# Patient Record
Sex: Male | Born: 1960
Health system: Southern US, Community
[De-identification: ages and names within clinical notes are randomized; demographics above are authoritative.]

## PROBLEM LIST (undated history)

## (undated) DIAGNOSIS — E785 Hyperlipidemia, unspecified: Secondary | ICD-10-CM

## (undated) DIAGNOSIS — R7303 Prediabetes: Secondary | ICD-10-CM

## (undated) DIAGNOSIS — Z87442 Personal history of urinary calculi: Secondary | ICD-10-CM

## (undated) DIAGNOSIS — C801 Malignant (primary) neoplasm, unspecified: Secondary | ICD-10-CM

---

## 1987-12-16 HISTORY — PX: VASECTOMY: SHX75

## 2010-12-15 HISTORY — PX: BACK SURGERY: SHX140

## 2011-05-13 ENCOUNTER — Ambulatory Visit: Payer: Self-pay | Admitting: Internal Medicine

## 2011-05-14 ENCOUNTER — Emergency Department: Payer: Self-pay | Admitting: Emergency Medicine

## 2011-05-20 ENCOUNTER — Ambulatory Visit: Payer: Self-pay

## 2013-09-09 ENCOUNTER — Ambulatory Visit: Payer: Self-pay | Admitting: Gastroenterology

## 2013-12-15 DIAGNOSIS — C801 Malignant (primary) neoplasm, unspecified: Secondary | ICD-10-CM

## 2013-12-15 HISTORY — DX: Malignant (primary) neoplasm, unspecified: C80.1

## 2016-12-15 DIAGNOSIS — E785 Hyperlipidemia, unspecified: Secondary | ICD-10-CM

## 2016-12-15 HISTORY — DX: Hyperlipidemia, unspecified: E78.5

## 2017-07-27 DIAGNOSIS — E7800 Pure hypercholesterolemia, unspecified: Secondary | ICD-10-CM | POA: Diagnosis not present

## 2017-07-27 DIAGNOSIS — R7302 Impaired glucose tolerance (oral): Secondary | ICD-10-CM | POA: Diagnosis not present

## 2017-07-27 DIAGNOSIS — Z0000 Encounter for general adult medical examination without abnormal findings: Secondary | ICD-10-CM | POA: Diagnosis not present

## 2017-07-27 DIAGNOSIS — Z125 Encounter for screening for malignant neoplasm of prostate: Secondary | ICD-10-CM | POA: Diagnosis not present

## 2017-10-12 DIAGNOSIS — Z23 Encounter for immunization: Secondary | ICD-10-CM | POA: Diagnosis not present

## 2017-12-02 DIAGNOSIS — M20012 Mallet finger of left finger(s): Secondary | ICD-10-CM | POA: Diagnosis not present

## 2017-12-29 ENCOUNTER — Other Ambulatory Visit: Payer: Self-pay | Admitting: Specialist

## 2018-01-05 ENCOUNTER — Encounter
Admission: RE | Admit: 2018-01-05 | Discharge: 2018-01-05 | Disposition: A | Discharge status: Home / Self Care | Payer: 59 | Source: Ambulatory Visit | Attending: Specialist | Admitting: Specialist

## 2018-01-05 ENCOUNTER — Other Ambulatory Visit: Payer: Self-pay

## 2018-01-05 HISTORY — DX: Malignant (primary) neoplasm, unspecified: C80.1

## 2018-01-05 HISTORY — DX: Prediabetes: R73.03

## 2018-01-05 HISTORY — DX: Personal history of urinary calculi: Z87.442

## 2018-01-05 HISTORY — DX: Hyperlipidemia, unspecified: E78.5

## 2018-01-05 NOTE — Patient Instructions (Signed)
Your procedure is scheduled on: Wednesday, January 13, 2018  Report to [place]  To find out your arrival time please call [phone] between 1PM - 3PM on Tuesday, January 12, 2018  Remember: Instructions that are not followed completely may result in serious medical risk, up to and including death, or upon the discretion of your surgeon and anesthesiologist your surgery may need to be rescheduled.     _X__ 1. Do not eat food after midnight the night before your procedure.                 No gum chewing or hard candies. You may drink clear liquids up to 2 hours                 before you are scheduled to arrive for your surgery- DO not drink clear                 liquids within 2 hours of the start of your surgery.                  Clear Liquids include:water, apple juice without pulp, clear carbohydrate                 drink such as Clearfast of Gartorade, Black Coffee or Tea (Do not add                 anything to coffee or tea).     _X__ 2.  No Alcohol for 24 hours before or after surgery.   _X__ 3.  Do Not Smoke or use e-cigarettes For 24 Hours Prior to Your Surgery.                 Do not use any chewable tobacco products for at least 6 hours prior to                 surgery.  ____  4.  Bring all medications with you on the day of surgery if instructed.   __x__  5.  Notify your doctor if there is any change in your medical condition      (cold, fever, infections).     Do not wear jewelry, make-up, hairpins, clips or nail polish. Do not wear lotions, powders, or perfumes. You may wear deodorant. Do not shave 48 hours prior to surgery. Men may shave face and neck. Do not bring valuables to the hospital.    [facility] is not responsible for any belongings or valuables.  Contacts, dentures or bridgework may not be worn into surgery. Leave your suitcase in the car. After surgery it may be brought to your room. For patients admitted  to the hospital, discharge time is determined by your treatment team.   Patients discharged the day of surgery will not be allowed to drive home.   Please read over the following fact sheets that you were given:                         [place] PHONE    ____ Take these medicines the morning of surgery with A SIP OF WATER:    1. NONE  2.   3.   4.  5.  6.  ____ Fleet Enema (as directed)   __x__ Use ANTIBACTERIAL SOAP ON THE MORNING OF SURGERY.  CLEAN UNDER YOUR NAILS.  ____ Use inhalers on the day of surgery  __X__ Stop ASPIRIN PRODUCTS  AS OF TODAY, January 05, 2018  __X__ Stop Anti-inflammatories AS OF TODAY.  THIS INCLUDES:               IBUPROFEN / MOTRIN / ADVIL / ALEVE / NAPROSYN / GOODIES POWDERS / BC POWDER                                         YOU MAY TAKE TYLENOL FOR DISCOMFORTS.   ____ Stop supplements until after surgery.    ____ Bring C-Pap to the hospital.   DO NOT WEAR CONTACTS ON THE DAY OF SURGERY  WEAR LOOSE FITTING SHIRT SO YOU CAN GET YOUR HAND AND WRAP IN SLEEVE

## 2018-01-12 MED ORDER — CLINDAMYCIN PHOSPHATE 600 MG/50ML IV SOLN
600.0000 mg | INTRAVENOUS | Status: AC
  Administered 2018-01-13: 600 mg via INTRAVENOUS

## 2018-01-12 MED ORDER — CEFAZOLIN SODIUM-DEXTROSE 2-4 GM/100ML-% IV SOLN
2.0000 g | INTRAVENOUS | Status: AC
  Administered 2018-01-13: 2 g via INTRAVENOUS

## 2018-01-13 ENCOUNTER — Ambulatory Visit: Payer: 59 | Admitting: Anesthesiology

## 2018-01-13 ENCOUNTER — Encounter
Admission: RE | Disposition: A | Discharge status: Home / Self Care | Payer: Self-pay | Source: Ambulatory Visit | Attending: Specialist

## 2018-01-13 ENCOUNTER — Encounter: Payer: Self-pay | Admitting: *Deleted

## 2018-01-13 ENCOUNTER — Ambulatory Visit
Admission: RE | Admit: 2018-01-13 | Discharge: 2018-01-13 | Disposition: A | Discharge status: Home / Self Care | Payer: 59 | Source: Ambulatory Visit | Attending: Specialist | Admitting: Specialist

## 2018-01-13 DIAGNOSIS — M20012 Mallet finger of left finger(s): Secondary | ICD-10-CM | POA: Insufficient documentation

## 2018-01-13 DIAGNOSIS — Z01810 Encounter for preprocedural cardiovascular examination: Secondary | ICD-10-CM | POA: Diagnosis not present

## 2018-01-13 DIAGNOSIS — I493 Ventricular premature depolarization: Secondary | ICD-10-CM | POA: Diagnosis not present

## 2018-01-13 DIAGNOSIS — E785 Hyperlipidemia, unspecified: Secondary | ICD-10-CM | POA: Insufficient documentation

## 2018-01-13 HISTORY — PX: REPAIR EXTENSOR TENDON: SHX5382

## 2018-01-13 SURGERY — REPAIR, TENDON, EXTENSOR
Anesthesia: General | Site: Finger | Laterality: Left | Wound class: Clean

## 2018-01-13 MED ORDER — FAMOTIDINE 20 MG PO TABS
ORAL_TABLET | ORAL | Status: AC
  Administered 2018-01-13: 20 mg via ORAL
  Filled 2018-01-13: qty 1

## 2018-01-13 MED ORDER — ACETAMINOPHEN 10 MG/ML IV SOLN
INTRAVENOUS | Status: DC | PRN
  Administered 2018-01-13: 1000 mg via INTRAVENOUS

## 2018-01-13 MED ORDER — PROMETHAZINE HCL 25 MG/ML IJ SOLN: 6.2500 mg | INTRAMUSCULAR | Status: DC | PRN

## 2018-01-13 MED ORDER — BUPIVACAINE HCL 0.5 % IJ SOLN
INTRAMUSCULAR | Status: DC | PRN
  Administered 2018-01-13: 10 mL

## 2018-01-13 MED ORDER — LACTATED RINGERS IV SOLN
INTRAVENOUS | Status: DC
  Administered 2018-01-13: 07:00:00 via INTRAVENOUS

## 2018-01-13 MED ORDER — MELOXICAM 15 MG PO TABS: 15.0000 mg | ORAL_TABLET | Freq: Every day | ORAL | 3 refills | Status: DC

## 2018-01-13 MED ORDER — FENTANYL CITRATE (PF) 100 MCG/2ML IJ SOLN
INTRAMUSCULAR | Status: AC
  Filled 2018-01-13: qty 2

## 2018-01-13 MED ORDER — MIDAZOLAM HCL 2 MG/2ML IJ SOLN
INTRAMUSCULAR | Status: AC
  Filled 2018-01-13: qty 2

## 2018-01-13 MED ORDER — MELOXICAM 7.5 MG PO TABS
ORAL_TABLET | ORAL | Status: AC
  Administered 2018-01-13: 15 mg via ORAL
  Filled 2018-01-13: qty 2

## 2018-01-13 MED ORDER — ACETAMINOPHEN 10 MG/ML IV SOLN
INTRAVENOUS | Status: AC
  Filled 2018-01-13: qty 100

## 2018-01-13 MED ORDER — LIDOCAINE HCL (PF) 2 % IJ SOLN
INTRAMUSCULAR | Status: AC
  Filled 2018-01-13: qty 10

## 2018-01-13 MED ORDER — CLINDAMYCIN PHOSPHATE 600 MG/50ML IV SOLN
INTRAVENOUS | Status: AC
  Filled 2018-01-13: qty 50

## 2018-01-13 MED ORDER — CHLORHEXIDINE GLUCONATE CLOTH 2 % EX PADS
6.0000 | MEDICATED_PAD | Freq: Once | CUTANEOUS | Status: AC
  Administered 2018-01-13: 6 via TOPICAL

## 2018-01-13 MED ORDER — GABAPENTIN 400 MG PO CAPS
400.0000 mg | ORAL_CAPSULE | ORAL | Status: AC
  Administered 2018-01-13: 400 mg via ORAL

## 2018-01-13 MED ORDER — FENTANYL CITRATE (PF) 100 MCG/2ML IJ SOLN: 25.0000 ug | INTRAMUSCULAR | Status: DC | PRN

## 2018-01-13 MED ORDER — GABAPENTIN 400 MG PO CAPS
ORAL_CAPSULE | ORAL | Status: AC
  Administered 2018-01-13: 400 mg via ORAL
  Filled 2018-01-13: qty 1

## 2018-01-13 MED ORDER — FAMOTIDINE 20 MG PO TABS
20.0000 mg | ORAL_TABLET | Freq: Once | ORAL | Status: AC
  Administered 2018-01-13: 20 mg via ORAL

## 2018-01-13 MED ORDER — SEVOFLURANE IN SOLN
RESPIRATORY_TRACT | Status: AC
  Filled 2018-01-13: qty 250

## 2018-01-13 MED ORDER — PROPOFOL 10 MG/ML IV BOLUS
INTRAVENOUS | Status: AC
  Filled 2018-01-13: qty 20

## 2018-01-13 MED ORDER — ONDANSETRON HCL 4 MG/2ML IJ SOLN
INTRAMUSCULAR | Status: DC | PRN
  Administered 2018-01-13: 4 mg via INTRAVENOUS

## 2018-01-13 MED ORDER — PHENYLEPHRINE HCL 10 MG/ML IJ SOLN
INTRAMUSCULAR | Status: DC | PRN
  Administered 2018-01-13: 100 ug via INTRAVENOUS
  Administered 2018-01-13: 200 ug via INTRAVENOUS
  Administered 2018-01-13 (×6): 100 ug via INTRAVENOUS

## 2018-01-13 MED ORDER — HYDROCODONE-ACETAMINOPHEN 7.5-325 MG PO TABS: 1.0000 | ORAL_TABLET | Freq: Four times a day (QID) | ORAL | 0 refills | Status: DC | PRN

## 2018-01-13 MED ORDER — CEFAZOLIN SODIUM-DEXTROSE 2-4 GM/100ML-% IV SOLN
INTRAVENOUS | Status: AC
  Filled 2018-01-13: qty 100

## 2018-01-13 MED ORDER — DEXAMETHASONE SODIUM PHOSPHATE 10 MG/ML IJ SOLN
INTRAMUSCULAR | Status: DC | PRN
  Administered 2018-01-13: 10 mg via INTRAVENOUS

## 2018-01-13 MED ORDER — GABAPENTIN 400 MG PO CAPS: 400.0000 mg | ORAL_CAPSULE | Freq: Two times a day (BID) | ORAL | 3 refills | Status: DC

## 2018-01-13 MED ORDER — LIDOCAINE HCL (CARDIAC) 20 MG/ML IV SOLN
INTRAVENOUS | Status: DC | PRN
  Administered 2018-01-13 (×2): 100 mg via INTRAVENOUS

## 2018-01-13 MED ORDER — MELOXICAM 7.5 MG PO TABS
15.0000 mg | ORAL_TABLET | ORAL | Status: AC
  Administered 2018-01-13: 15 mg via ORAL

## 2018-01-13 MED ORDER — PROPOFOL 10 MG/ML IV BOLUS
INTRAVENOUS | Status: DC | PRN
  Administered 2018-01-13: 200 mg via INTRAVENOUS

## 2018-01-13 MED ORDER — FENTANYL CITRATE (PF) 100 MCG/2ML IJ SOLN
INTRAMUSCULAR | Status: DC | PRN
  Administered 2018-01-13: 50 ug via INTRAVENOUS
  Administered 2018-01-13: 25 ug via INTRAVENOUS

## 2018-01-13 MED ORDER — MIDAZOLAM HCL 2 MG/2ML IJ SOLN
INTRAMUSCULAR | Status: DC | PRN
  Administered 2018-01-13: 2 mg via INTRAVENOUS

## 2018-01-13 MED ORDER — BUPIVACAINE HCL (PF) 0.5 % IJ SOLN
INTRAMUSCULAR | Status: AC
  Filled 2018-01-13: qty 30

## 2018-01-13 SURGICAL SUPPLY — 42 items
BAND RUBBER 3X1/6 TAN STRL (MISCELLANEOUS) IMPLANT
BLADE SURG MINI STRL (BLADE) ×2 IMPLANT
BNDG ESMARK 4X12 TAN STRL LF (GAUZE/BANDAGES/DRESSINGS) ×2 IMPLANT
C-wire .062 ×2 IMPLANT
CANISTER SUCT 1200ML W/VALVE (MISCELLANEOUS) ×2 IMPLANT
CHLORAPREP W/TINT 26ML (MISCELLANEOUS) ×2 IMPLANT
CUFF TOURN 18 STER (MISCELLANEOUS) ×2 IMPLANT
ELECT REM PT RETURN 9FT ADLT (ELECTROSURGICAL) ×2
ELECTRODE REM PT RTRN 9FT ADLT (ELECTROSURGICAL) ×1 IMPLANT
GAUZE FLUFF 18X24 1PLY STRL (GAUZE/BANDAGES/DRESSINGS) ×2 IMPLANT
GAUZE PETRO XEROFOAM 1X8 (MISCELLANEOUS) ×2 IMPLANT
GAUZE SPONGE 4X4 12PLY STRL (GAUZE/BANDAGES/DRESSINGS) ×2 IMPLANT
GAUZE STRETCH 2X75IN STRL (MISCELLANEOUS) ×2 IMPLANT
GLOVE SURG ORTHO 8.0 STRL STRW (GLOVE) ×2 IMPLANT
GOWN STRL REUS W/ TWL LRG LVL3 (GOWN DISPOSABLE) ×1 IMPLANT
GOWN STRL REUS W/TWL LRG LVL3 (GOWN DISPOSABLE) ×1
GOWN STRL REUS W/TWL LRG LVL4 (GOWN DISPOSABLE) ×2 IMPLANT
KIT RM TURNOVER STRD PROC AR (KITS) ×2 IMPLANT
NDL KEITH SZ2.5 (NEEDLE) IMPLANT
NS IRRIG 500ML POUR BTL (IV SOLUTION) ×2 IMPLANT
PACK EXTREMITY ARMC (MISCELLANEOUS) ×2 IMPLANT
PAD CAST CTTN 4X4 STRL (SOFTGOODS) IMPLANT
PADDING CAST 3IN STRL (MISCELLANEOUS)
PADDING CAST BLEND 3X4 STRL (MISCELLANEOUS) IMPLANT
PADDING CAST COTTON 4X4 STRL (SOFTGOODS)
SPLINT CAST 1 STEP 3X12 (MISCELLANEOUS) IMPLANT
SPLINT FINGER (SOFTGOODS) ×2 IMPLANT
STOCKINETTE BIAS CUT 3 980034 (MISCELLANEOUS) ×2 IMPLANT
STOCKINETTE BIAS CUT 4 980044 (GAUZE/BANDAGES/DRESSINGS) ×2 IMPLANT
STOCKINETTE STRL 4IN 9604848 (GAUZE/BANDAGES/DRESSINGS) ×2 IMPLANT
SUT ETHIBOND 3-0  EXTR (SUTURE)
SUT ETHIBOND 3-0 EXTR (SUTURE) IMPLANT
SUT ETHILON 4 0 P 3 18 (SUTURE) ×2 IMPLANT
SUT ETHILON 4-0 (SUTURE) ×1
SUT ETHILON 4-0 FS2 18XMFL BLK (SUTURE) ×1
SUT ETHILON 5-0 (SUTURE) ×1
SUT ETHILON 5-0 C-3 18XMFL BLK (SUTURE) ×1
SUT POLY BUTTON 15MM (SUTURE) IMPLANT
SUT VIC AB 4-0 FS2 27 (SUTURE) IMPLANT
SUTURE ETHLN 4-0 FS2 18XMF BLK (SUTURE) ×1 IMPLANT
SUTURE ETHLN 5-0 C3 18XMF BLK (SUTURE) ×1 IMPLANT
WIRE Z .062 C-WIRE SPADE TIP (WIRE) ×2 IMPLANT

## 2018-01-13 NOTE — OR Nursing (Signed)
Dr. Rosey Bath in to speak with patient and family [phone]

## 2018-01-13 NOTE — Anesthesia Post-op Follow-up Note (Signed)
Anesthesia QCDR form completed.        

## 2018-01-13 NOTE — Op Note (Signed)
01/13/2018  8:38 AM  PATIENT:  Joshua Francis  57 y.o. male  PRE-OPERATIVE DIAGNOSIS:  M20.012 Mallet Finger of Left Finger  POST-OPERATIVE DIAGNOSIS:  Same  PROCEDURE:  REPAIR EXTENSOR TENDON LEFT RING RINGER WITH PINNING OF JOINT  SURGEON:  Verlisa Vara E Lilyrose Tanney MD  ASST:    ANESTHESIA:   General  EBL: None  TOURNIQUET TIME: 29 minutes  OPERATIVE FINDINGS: Extensor mechanism scarred in an extended position  OPERATIVE PROCEDURE: The patient was brought to the operating room and underwent satisfactory general LMA anesthesia in a supine position.  Anesthesia noted some PVCs but deemed it safe to proceed with surgery.  Arm was prepped and draped in a sterile fashion and Esmarch applied.  Tourniquet was inflated to 250 mmHg.  A dorsal incision was made over the DIP joint of the left ring finger.  Dissection was carried out bluntly through subtenons tissue and the extensor mechanism was exposed.  X-rays had shown that there was no fracture.  The extensor mechanism had scarred in a stretched out position.  Placed the DIP joint in full extension and placed a 0.62 C-wire across the joint to stabilize it.  This was bent and buried.  Next an elliptical incision was made removing a small portion of the extensor mechanism to allow the to take up the slack clear.  Tendon was freed up from bone and then sutured with 4-0 Vicryl sutures.  This provided good apposition and coverage.  The wound was irrigated and closed with 5-0 nylon sutures.  Pin was buried and oversewn.  Dry sterile dressing finger splint was applied.  Patient was awakened and taken recovery in good condition.  Anesthesia department plan to get a cardiology consultation before the patient leaves.  Park Breed, MD

## 2018-01-13 NOTE — Anesthesia Preprocedure Evaluation (Signed)
Anesthesia Evaluation  Patient identified by MRN, date of birth, ID band Patient awake    Reviewed: Allergy & Precautions, H&P , NPO status , Patient's Chart, lab work & pertinent test results, reviewed documented beta blocker date and time   History of Anesthesia Complications Negative for: history of anesthetic complications  Airway Mallampati: II  TM Distance: >3 FB Neck ROM: full    Dental  (+) Caps, Missing   Pulmonary neg pulmonary ROS,           Cardiovascular Exercise Tolerance: Good negative cardio ROS       Neuro/Psych negative neurological ROS  negative psych ROS   GI/Hepatic negative GI ROS, Neg liver ROS,   Endo/Other  negative endocrine ROS  Renal/GU Renal disease (kidney stones)  negative genitourinary   Musculoskeletal   Abdominal   Peds  Hematology negative hematology ROS (+)   Anesthesia Other Findings Past Medical History: 2015: Cancer ([place])     Comment:  skin tags removed that may have been precancerous No date: History of kidney stones 2018: Hyperlipidemia No date: Pre-diabetes     Comment:  stable since losing weight.no meds ever. follows good               diet   Reproductive/Obstetrics negative OB ROS                             Anesthesia Physical Anesthesia Plan  ASA: I  Anesthesia Plan: General   Post-op Pain Management:    Induction: Intravenous  PONV Risk Score and Plan: 2 and Ondansetron and Dexamethasone  Airway Management Planned: LMA  Additional Equipment:   Intra-op Plan:   Post-operative Plan: Extubation in OR  Informed Consent: I have reviewed the patients History and Physical, chart, labs and discussed the procedure including the risks, benefits and alternatives for the proposed anesthesia with the patient or authorized representative who has indicated his/her understanding and acceptance.   Dental Advisory Given  Plan  Discussed with: Anesthesiologist, CRNA and Surgeon  Anesthesia Plan Comments:         Anesthesia Quick Evaluation

## 2018-01-13 NOTE — Transfer of Care (Signed)
Immediate Anesthesia Transfer of Care Note  Patient: Joshua Francis  Procedure(s) Performed: REPAIR EXTENSOR TENDON (Left Finger)  Patient Location: PACU  Anesthesia Type:General  Level of Consciousness: sedated  Airway & Oxygen Therapy: Patient Spontanous Breathing and Patient connected to nasal cannula oxygen  Post-op Assessment: Report given to RN and Post -op Vital signs reviewed and stable  Post vital signs: Reviewed and stable  Last Vitals:  Vitals:   01/13/18 0648 01/13/18 0846  BP: (!) 149/73 116/79  Pulse: (!) 115 78  Resp: (!) 21 11  Temp: 36.6 C (!) 36.1 C  SpO2: 100% 99%    Last Pain:  Vitals:   01/13/18 0648  TempSrc: Tympanic         Complications: No apparent anesthesia complications

## 2018-01-13 NOTE — H&P (Signed)
THE PATIENT WAS SEEN PRIOR TO SURGERY TODAY.  HISTORY, ALLERGIES, HOME MEDICATIONS AND OPERATIVE PROCEDURE WERE REVIEWED. RISKS AND BENEFITS OF SURGERY DISCUSSED WITH PATIENT AGAIN.  NO CHANGES FROM INITIAL HISTORY AND PHYSICAL NOTED.    

## 2018-01-13 NOTE — Discharge Instructions (Signed)

## 2018-01-13 NOTE — Anesthesia Postprocedure Evaluation (Signed)
Anesthesia Post Note  Patient: Joshua Francis  Procedure(s) Performed: REPAIR EXTENSOR TENDON (Left Finger)  Patient location during evaluation: PACU Anesthesia Type: General Level of consciousness: awake and alert Pain management: pain level controlled Vital Signs Assessment: post-procedure vital signs reviewed and stable Respiratory status: spontaneous breathing, nonlabored ventilation, respiratory function stable and patient connected to nasal cannula oxygen Cardiovascular status: blood pressure returned to baseline and stable Postop Assessment: no apparent nausea or vomiting Anesthetic complications: no     Last Vitals:  Vitals:   01/13/18 0929 01/13/18 0956  BP: 123/78 114/62  Pulse: 67 64  Resp: 18 18  Temp: 36.5 C (!) 36.2 C  SpO2: 99% 100%    Last Pain:  Vitals:   01/13/18 0956  TempSrc: Temporal  PainSc: 1                  Martha Clan

## 2018-01-13 NOTE — Anesthesia Procedure Notes (Signed)
Procedure Name: LMA Insertion Date/Time: 01/13/2018 7:41 AM Performed by: Johnna Acosta, CRNA Pre-anesthesia Checklist: Patient identified, Emergency Drugs available, Suction available, Patient being monitored and Timeout performed Patient Re-evaluated:Patient Re-evaluated prior to induction Oxygen Delivery Method: Circle system utilized Preoxygenation: Pre-oxygenation with 100% oxygen Induction Type: IV induction LMA: LMA inserted LMA Size: 5.0 Grade View: Grade I Tube type: Oral Number of attempts: 1 Placement Confirmation: positive ETCO2 and breath sounds checked- equal and bilateral Tube secured with: Tape Dental Injury: Teeth and Oropharynx as per pre-operative assessment

## 2018-07-19 DIAGNOSIS — M659 Synovitis and tenosynovitis, unspecified: Secondary | ICD-10-CM | POA: Diagnosis not present

## 2018-07-19 DIAGNOSIS — M2021 Hallux rigidus, right foot: Secondary | ICD-10-CM | POA: Diagnosis not present

## 2018-07-28 DIAGNOSIS — R7302 Impaired glucose tolerance (oral): Secondary | ICD-10-CM | POA: Diagnosis not present

## 2018-07-28 DIAGNOSIS — Z0000 Encounter for general adult medical examination without abnormal findings: Secondary | ICD-10-CM | POA: Diagnosis not present

## 2018-07-28 DIAGNOSIS — E7800 Pure hypercholesterolemia, unspecified: Secondary | ICD-10-CM | POA: Diagnosis not present

## 2018-07-28 DIAGNOSIS — Z125 Encounter for screening for malignant neoplasm of prostate: Secondary | ICD-10-CM | POA: Diagnosis not present

## 2018-09-07 DIAGNOSIS — Z08 Encounter for follow-up examination after completed treatment for malignant neoplasm: Secondary | ICD-10-CM | POA: Diagnosis not present

## 2018-09-07 DIAGNOSIS — L570 Actinic keratosis: Secondary | ICD-10-CM | POA: Diagnosis not present

## 2018-09-07 DIAGNOSIS — L728 Other follicular cysts of the skin and subcutaneous tissue: Secondary | ICD-10-CM | POA: Diagnosis not present

## 2018-09-07 DIAGNOSIS — Z85828 Personal history of other malignant neoplasm of skin: Secondary | ICD-10-CM | POA: Diagnosis not present

## 2018-09-07 DIAGNOSIS — X32XXXA Exposure to sunlight, initial encounter: Secondary | ICD-10-CM | POA: Diagnosis not present

## 2018-09-21 DIAGNOSIS — Z23 Encounter for immunization: Secondary | ICD-10-CM | POA: Diagnosis not present

## 2020-10-25 ENCOUNTER — Other Ambulatory Visit: Payer: Self-pay | Admitting: Family Medicine

## 2020-10-25 DIAGNOSIS — I2699 Other pulmonary embolism without acute cor pulmonale: Secondary | ICD-10-CM

## 2020-10-29 ENCOUNTER — Ambulatory Visit
Admission: RE | Admit: 2020-10-29 | Discharge: 2020-10-29 | Disposition: A | Discharge status: Home / Self Care | Payer: BC Managed Care – PPO | Source: Ambulatory Visit | Attending: Family Medicine | Admitting: Family Medicine

## 2020-10-29 ENCOUNTER — Other Ambulatory Visit: Payer: Self-pay

## 2020-10-29 DIAGNOSIS — I2699 Other pulmonary embolism without acute cor pulmonale: Secondary | ICD-10-CM | POA: Diagnosis not present

## 2020-10-31 ENCOUNTER — Inpatient Hospital Stay: Payer: BC Managed Care – PPO | Attending: Internal Medicine | Admitting: Internal Medicine

## 2020-10-31 ENCOUNTER — Inpatient Hospital Stay: Payer: BC Managed Care – PPO

## 2020-10-31 ENCOUNTER — Encounter: Payer: Self-pay | Admitting: Internal Medicine

## 2020-10-31 DIAGNOSIS — Z7901 Long term (current) use of anticoagulants: Secondary | ICD-10-CM | POA: Diagnosis not present

## 2020-10-31 DIAGNOSIS — I2699 Other pulmonary embolism without acute cor pulmonale: Secondary | ICD-10-CM

## 2020-10-31 DIAGNOSIS — I2693 Single subsegmental pulmonary embolism without acute cor pulmonale: Secondary | ICD-10-CM | POA: Diagnosis not present

## 2020-10-31 NOTE — Progress Notes (Signed)
Joshua Francis CONSULT NOTE  Patient Care Team: Joshua Hartigan, MD as PCP - General (Family Medicine)  CHIEF COMPLAINTS/PURPOSE OF CONSULTATION: DVT/PE  # NOV, 6th th 2021-  Left lower lobe consolidation, favored to represent aspiration versus pneumonia/ Likely left lower subsegmental pulmonary embolism (4:86) with distal pulmonary infarction; 2 hours flight to [facility] [1st Oct 2021]. SEP 30th- R LE swelling/pain- NO Dopplers done.- on ELiquis.   # NOV [id number]-  Examination is positive for occlusive superficial thrombophlebitis involving the right lesser saphenous vein. Again,there is no extension of this occlusive SVT to the deep venous system of the right lower extremity.  Oncology History   No history exists.     HISTORY OF PRESENTING ILLNESS:  Joshua Francis 59 y.o.  male has been referred to [place] for further evaluation recommendations for PE.  Patient noted to have right leg swelling and pain in end of September 2021.  2 weeks later noted to have pleuritic posterior chest wall pain; which was further evaluated by [facility] emergency room with a CTA that showed left subsegmental PE; infarction.   With regards risk factors: Long distance travel- 2 hours flight 1 month prior Immobilization/trauma: NONE Previous history of DVT/PE: NONE Family history: NONE  Review of Systems  Constitutional: Negative for chills, diaphoresis, fever, malaise/fatigue and weight loss.  HENT: Negative for nosebleeds and sore throat.   Eyes: Negative for double vision.  Respiratory: Negative for cough, hemoptysis, sputum production, shortness of breath and wheezing.   Cardiovascular: Negative for chest pain, palpitations, orthopnea and leg swelling.  Gastrointestinal: Negative for abdominal pain, blood in stool, constipation, diarrhea, heartburn, melena, nausea and vomiting.  Genitourinary: Negative for dysuria, frequency and urgency.  Musculoskeletal: Negative for back pain and joint pain.   Skin: Negative.  Negative for itching and rash.  Neurological: Negative for dizziness, tingling, focal weakness, weakness and headaches.  Endo/Heme/Allergies: Does not bruise/bleed easily.  Psychiatric/Behavioral: Negative for depression. The patient is not nervous/anxious and does not have insomnia.      MEDICAL HISTORY:  Past Medical History:  Diagnosis Date  . Cancer ([place]) 2015   skin tags removed that may have been precancerous  . History of kidney stones   . Hyperlipidemia 2018  . Pre-diabetes    stable since losing weight.no meds ever. follows good diet    SURGICAL HISTORY: Past Surgical History:  Procedure Laterality Date  . BACK SURGERY  2012   c4-5, c5-6.  metal in neck  . REPAIR EXTENSOR TENDON Left 01/13/2018   Procedure: REPAIR EXTENSOR TENDON;  Surgeon: Earnestine Leys, MD;  Location: [facility] ORS;  Service: Orthopedics;  Laterality: Left;  left ring finger  . VASECTOMY  1989    SOCIAL HISTORY: Social History   Socioeconomic History  . Marital status: Married    Spouse name: Not on file  . Number of children: Not on file  . Years of education: Not on file  . Highest education level: Not on file  Occupational History  . Not on file  Tobacco Use  . Smoking status: Never Smoker  . Smokeless tobacco: Never Used  Vaping Use  . Vaping Use: Never used  Substance and Sexual Activity  . Alcohol use: No  . Drug use: No  . Sexual activity: Not on file  Other Topics Concern  . Not on file  Social History Narrative   No smoking; no alcohol; lives in [place] with wife; works in office.    Social Determinants of Health   Financial  Resource Strain:   . Difficulty of Paying Living Expenses: Not on file  Food Insecurity:   . Worried About Charity fundraiser in the Last Year: Not on file  . Ran Out of Food in the Last Year: Not on file  Transportation Needs:   . Lack of Transportation (Medical): Not on file  . Lack of Transportation (Non-Medical): Not on file   Physical Activity:   . Days of Exercise per Week: Not on file  . Minutes of Exercise per Session: Not on file  Stress:   . Feeling of Stress : Not on file  Social Connections:   . Frequency of Communication with Friends and Family: Not on file  . Frequency of Social Gatherings with Friends and Family: Not on file  . Attends Religious Services: Not on file  . Active Member of Clubs or Organizations: Not on file  . Attends Archivist Meetings: Not on file  . Marital Status: Not on file  Intimate Partner Violence:   . Fear of Current or Ex-Partner: Not on file  . Emotionally Abused: Not on file  . Physically Abused: Not on file  . Sexually Abused: Not on file    FAMILY HISTORY: Family History  Problem Relation Age of Onset  . Diabetes Mother   . Diabetes Sister     ALLERGIES:  has No Known Allergies.  MEDICATIONS:  Current Outpatient Medications  Medication Sig Dispense Refill  . ELIQUIS 5 MG TABS tablet Take 5 mg by mouth 2 (two) times daily.     No current facility-administered medications for this visit.      Marland Kitchen  PHYSICAL EXAMINATION:  Vitals:   10/31/20 1357  BP: (!) 137/91  Pulse: 100  Resp: 18  Temp: (!) 97.5 F (36.4 C)  SpO2: 99%   Filed Weights   10/31/20 1357  Weight: 157 lb 3 oz (71.3 kg)    Physical Exam HENT:     Head: Normocephalic and atraumatic.     Mouth/Throat:     Pharynx: No oropharyngeal exudate.  Eyes:     Pupils: Pupils are equal, round, and reactive to light.  Cardiovascular:     Rate and Rhythm: Normal rate and regular rhythm.  Pulmonary:     Effort: Pulmonary effort is normal. No respiratory distress.     Breath sounds: Normal breath sounds. No wheezing.  Abdominal:     General: Bowel sounds are normal. There is no distension.     Palpations: Abdomen is soft. There is no mass.     Tenderness: There is no abdominal tenderness. There is no guarding or rebound.  Musculoskeletal:        General: No tenderness.  Normal range of motion.     Cervical back: Normal range of motion and neck supple.  Skin:    General: Skin is warm.  Neurological:     Mental Status: He is alert and oriented to person, place, and time.  Psychiatric:        Mood and Affect: Affect normal.      LABORATORY DATA:  I have reviewed the data as listed No results found for: WBC, HGB, HCT, MCV, PLT No results for input(s): NA, K, CL, CO2, GLUCOSE, BUN, CREATININE, CALCIUM, GFRNONAA, GFRAA, PROT, ALBUMIN, AST, ALT, ALKPHOS, BILITOT, BILIDIR, IBILI in the last 8760 hours.  RADIOGRAPHIC STUDIES: I have personally reviewed the radiological images as listed and agreed with the findings in the report. US Venous Img Lower Bilateral (DVT)  Result  Date: 10/29/2020 CLINICAL DATA:  History of pulmonary embolism.  Evaluate for DVT. EXAM: BILATERAL LOWER EXTREMITY VENOUS DOPPLER ULTRASOUND TECHNIQUE: Gray-scale sonography with graded compression, as well as color Doppler and duplex ultrasound were performed to evaluate the lower extremity deep venous systems from the level of the common femoral vein and including the common femoral, femoral, profunda femoral, popliteal and calf veins including the posterior tibial, peroneal and gastrocnemius veins when visible. The superficial great saphenous vein was also interrogated. Spectral Doppler was utilized to evaluate flow at rest and with distal augmentation maneuvers in the common femoral, femoral and popliteal veins. COMPARISON:  None. FINDINGS: RIGHT LOWER EXTREMITY Common Femoral Vein: No evidence of thrombus. Normal compressibility, respiratory phasicity and response to augmentation. Saphenofemoral Junction: No evidence of thrombus. Normal compressibility and flow on color Doppler imaging. Profunda Femoral Vein: No evidence of thrombus. Normal compressibility and flow on color Doppler imaging. Femoral Vein: No evidence of thrombus. Normal compressibility, respiratory phasicity and response to  augmentation. Popliteal Vein: No evidence of thrombus. Normal compressibility, respiratory phasicity and response to augmentation. Calf Veins: No evidence of thrombus. Normal compressibility and flow on color Doppler imaging. Superficial Great Saphenous Vein: No evidence of thrombus. Normal compressibility. Venous Reflux:  None. Other Findings: There is hypoechoic occlusive thrombus involving the right lesser saphenous vein (image 24). LEFT LOWER EXTREMITY Common Femoral Vein: No evidence of thrombus. Normal compressibility, respiratory phasicity and response to augmentation. Saphenofemoral Junction: No evidence of thrombus. Normal compressibility and flow on color Doppler imaging. Profunda Femoral Vein: No evidence of thrombus. Normal compressibility and flow on color Doppler imaging. Femoral Vein: No evidence of thrombus. Normal compressibility, respiratory phasicity and response to augmentation. Popliteal Vein: No evidence of thrombus. Normal compressibility, respiratory phasicity and response to augmentation. Calf Veins: No evidence of thrombus. Normal compressibility and flow on color Doppler imaging. Superficial Great Saphenous Vein: No evidence of thrombus. Normal compressibility. Venous Reflux:  None. Other Findings:  None. IMPRESSION: 1. No evidence of DVT within either lower extremity. 2. Examination is positive for occlusive superficial thrombophlebitis involving the right lesser saphenous vein. Again, there is no extension of this occlusive SVT to the deep venous system of the right lower extremity. Electronically Signed   By: Sandi Mariscal M.D.   On: 10/29/2020 16:38    ASSESSMENT & PLAN:   Pulmonary embolism on left (HCC) #Left lower lobe subsegmental PE with distal infarction/left lower lobe consolidation-unprovoked.  Continue Eliquis for now x6 months [until end of May 2022].   #Etiology-of PE is unclear; seems to be unprovoked.  Recommend further work-up after finishing off  anticoagulation-hypercoagulable work-up; D-dimer labs.  # Booster: I discussed with the patient that the benefits of vaccination overwhelm the risk of complications; especially this has to be weighed in the context of hypercoagulable state caused by Covid illness.  Patient proceed with booster vaccination.  Thank you Dr. Ellison Hughs for allowing me to participate in the care of your pleasant patient. Please do not hesitate to contact me with questions or concerns in the interim.  # DISPOSITION: #  Follow up in late JUNE 2021- MD; labs- ordered-- Dr.B   All questions were answered. The patient knows to call the clinic with any problems, questions or concerns.    Cammie Sickle, MD 10/31/2020 8:00 PM

## 2020-10-31 NOTE — Assessment & Plan Note (Addendum)
#  Left lower lobe subsegmental PE with distal infarction/left lower lobe consolidation-unprovoked.  Continue Eliquis for now x6 months [until end of May 2022].   #Etiology-of PE is unclear; seems to be unprovoked.  Recommend further work-up after finishing off anticoagulation-hypercoagulable work-up; D-dimer labs.  # Booster: I discussed with the patient that the benefits of vaccination overwhelm the risk of complications; especially this has to be weighed in the context of hypercoagulable state caused by Covid illness.  Patient proceed with booster vaccination.  Thank you Dr. Ellison Hughs for allowing me to participate in the care of your pleasant patient. Please do not hesitate to contact me with questions or concerns in the interim.  # DISPOSITION: #  Follow up in late JUNE 2021- MD; labs- ordered-- Dr.B

## 2021-06-12 ENCOUNTER — Inpatient Hospital Stay (HOSPITAL_BASED_OUTPATIENT_CLINIC_OR_DEPARTMENT_OTHER): Payer: BC Managed Care – PPO | Admitting: Internal Medicine

## 2021-06-12 ENCOUNTER — Inpatient Hospital Stay: Payer: BC Managed Care – PPO | Attending: Internal Medicine

## 2021-06-12 DIAGNOSIS — I2699 Other pulmonary embolism without acute cor pulmonale: Secondary | ICD-10-CM

## 2021-06-12 LAB — CBC WITH DIFFERENTIAL/PLATELET
Abs Immature Granulocytes: 0.01 10*3/uL (ref 0.00–0.07)
Basophils Absolute: 0 10*3/uL (ref 0.0–0.1)
Basophils Relative: 0 %
Eosinophils Absolute: 0.1 10*3/uL (ref 0.0–0.5)
Eosinophils Relative: 2 %
HCT: 40.4 % (ref 39.0–52.0)
Hemoglobin: 13.9 g/dL (ref 13.0–17.0)
Immature Granulocytes: 0 %
Lymphocytes Relative: 25 %
Lymphs Abs: 1.5 10*3/uL (ref 0.7–4.0)
MCH: 32.8 pg (ref 26.0–34.0)
MCHC: 34.4 g/dL (ref 30.0–36.0)
MCV: 95.3 fL (ref 80.0–100.0)
Monocytes Absolute: 0.4 10*3/uL (ref 0.1–1.0)
Monocytes Relative: 7 %
Neutro Abs: 3.8 10*3/uL (ref 1.7–7.7)
Neutrophils Relative %: 66 %
Platelets: 194 10*3/uL (ref 150–400)
RBC: 4.24 MIL/uL (ref 4.22–5.81)
RDW: 12.6 % (ref 11.5–15.5)
WBC: 5.8 10*3/uL (ref 4.0–10.5)
nRBC: 0 % (ref 0.0–0.2)

## 2021-06-12 LAB — COMPREHENSIVE METABOLIC PANEL
ALT: 20 U/L (ref 0–44)
AST: 16 U/L (ref 15–41)
Albumin: 4.5 g/dL (ref 3.5–5.0)
Alkaline Phosphatase: 58 U/L (ref 38–126)
Anion gap: 8 (ref 5–15)
BUN: 14 mg/dL (ref 6–20)
CO2: 27 mmol/L (ref 22–32)
Calcium: 9.2 mg/dL (ref 8.9–10.3)
Chloride: 103 mmol/L (ref 98–111)
Creatinine, Ser: 1.03 mg/dL (ref 0.61–1.24)
GFR, Estimated: >60 mL/min (ref >60)
Glucose, Bld: 108 mg/dL — ABNORMAL HIGH (ref 70–99)
Potassium: 3.9 mmol/L (ref 3.5–5.1)
Sodium: 138 mmol/L (ref 135–145)
Total Bilirubin: 0.9 mg/dL (ref 0.3–1.2)
Total Protein: 7.5 g/dL (ref 6.5–8.1)

## 2021-06-12 NOTE — Progress Notes (Signed)
[place] CONSULT NOTE  Patient Care Team: Joshua Hartigan, MD as PCP - General (Family Medicine)  CHIEF COMPLAINTS/PURPOSE OF CONSULTATION: DVT/PE  # NOV, 6th th 2021-  Left lower lobe consolidation, favored to represent aspiration versus pneumonia/ Likely left lower subsegmental pulmonary embolism (4:86) with distal pulmonary infarction; 2 hours flight to [facility] [1st Oct 2021]. SEP 30th- R LE swelling/pain- NO Dopplers done.- on ELiquis.   # NOV [id number]-  Examination is positive for occlusive superficial thrombophlebitis involving the right lesser saphenous vein. Again,there is no extension of this occlusive SVT to the deep venous system of the right lower extremity.  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    Oncology History   No history exists.     HISTORY OF PRESENTING ILLNESS:  Joshua Francis 60 y.o.  male with a history of unprovoked PE- x 6 months of Eliquis [finished May 2022] is here for a follow up.   Patient denies any unusual shortness of breath cough.  Denies any swelling in the legs.  Review of Systems  Constitutional:  Negative for chills, diaphoresis, fever, malaise/fatigue and weight loss.  HENT:  Negative for nosebleeds and sore throat.   Eyes:  Negative for double vision.  Respiratory:  Negative for cough, hemoptysis, sputum production, shortness of breath and wheezing.   Cardiovascular:  Negative for chest pain, palpitations, orthopnea and leg swelling.  Gastrointestinal:  Negative for abdominal pain, blood in stool, constipation, diarrhea, heartburn, melena, nausea and vomiting.  Genitourinary:  Negative for dysuria, frequency and urgency.  Musculoskeletal:  Negative for back pain and joint pain.  Skin: Negative.  Negative for itching and rash.  Neurological:  Negative for dizziness, tingling, focal weakness, weakness and headaches.  Endo/Heme/Allergies:  Does not bruise/bleed easily.  Psychiatric/Behavioral:  Negative for depression. The patient is  not nervous/anxious and does not have insomnia.     MEDICAL HISTORY:  Past Medical History:  Diagnosis Date   Cancer ([place]) 2015   skin tags removed that may have been precancerous   History of kidney stones    Hyperlipidemia 2018   Pre-diabetes    stable since losing weight.no meds ever. follows good diet    SURGICAL HISTORY: Past Surgical History:  Procedure Laterality Date   BACK SURGERY  2012   c4-5, c5-6.  metal in neck   REPAIR EXTENSOR TENDON Left 01/13/2018   Procedure: REPAIR EXTENSOR TENDON;  Surgeon: Joshua Leys, MD;  Location: [facility] ORS;  Service: Orthopedics;  Laterality: Left;  left ring finger   VASECTOMY  1989    SOCIAL HISTORY: Social History   Socioeconomic History   Marital status: Married    Spouse name: Not on file   Number of children: Not on file   Years of education: Not on file   Highest education level: Not on file  Occupational History   Not on file  Tobacco Use   Smoking status: Never   Smokeless tobacco: Never  Vaping Use   Vaping Use: Never used  Substance and Sexual Activity   Alcohol use: No   Drug use: No   Sexual activity: Not on file  Other Topics Concern   Not on file  Social History Narrative   No smoking; no alcohol; lives in [place] with wife; works in office.    Social Determinants of Health   Financial Resource Strain: Not on file  Food Insecurity: Not on file  Transportation Needs: Not on file  Physical Activity: Not on file  Stress: Not on file  Social Connections: Not on file  Intimate Partner Violence: Not on file    FAMILY HISTORY: Family History  Problem Relation Age of Onset   Diabetes Mother    Diabetes Sister     ALLERGIES:  has No Known Allergies.  MEDICATIONS:  Current Outpatient Medications  Medication Sig Dispense Refill   ELIQUIS 5 MG TABS tablet Take 5 mg by mouth 2 (two) times daily. (Patient not taking: Reported on 06/12/2021)     No current facility-administered medications for this  visit.      Marland Kitchen  PHYSICAL EXAMINATION:  Vitals:   06/12/21 1332  BP: 138/75  Pulse: 72  Resp: 20  Temp: 98 F (36.7 C)  SpO2: 97%   Filed Weights   06/12/21 1332  Weight: 166 lb 12.8 oz (75.7 kg)    Physical Exam HENT:     Head: Normocephalic and atraumatic.     Mouth/Throat:     Pharynx: No oropharyngeal exudate.  Eyes:     Pupils: Pupils are equal, round, and reactive to light.  Cardiovascular:     Rate and Rhythm: Normal rate and regular rhythm.  Pulmonary:     Effort: Pulmonary effort is normal. No respiratory distress.     Breath sounds: Normal breath sounds. No wheezing.  Abdominal:     General: Bowel sounds are normal. There is no distension.     Palpations: Abdomen is soft. There is no mass.     Tenderness: no abdominal tenderness There is no guarding or rebound.  Musculoskeletal:        General: No tenderness. Normal range of motion.     Cervical back: Normal range of motion and neck supple.  Skin:    General: Skin is warm.  Neurological:     Mental Status: He is alert and oriented to person, place, and time.  Psychiatric:        Mood and Affect: Affect normal.     LABORATORY DATA:  I have reviewed the data as listed Lab Results  Component Value Date   WBC 5.8 06/12/2021   HGB 13.9 06/12/2021   HCT 40.4 06/12/2021   MCV 95.3 06/12/2021   PLT 194 06/12/2021   Recent Labs    06/12/21 1305  NA 138  K 3.9  CL 103  CO2 27  GLUCOSE 108*  BUN 14  CREATININE 1.03  CALCIUM 9.2  GFRNONAA >60  PROT 7.5  ALBUMIN 4.5  AST 16  ALT 20  ALKPHOS 58  BILITOT 0.9    RADIOGRAPHIC STUDIES: I have personally reviewed the radiological images as listed and agreed with the findings in the report. No results found.   ASSESSMENT & PLAN:   Pulmonary embolism on left (HCC) #Left lower lobe subsegmental PE with distal infarction/left lower lobe consolidation-unprovoked.  S/p Eliquis now x6 months [until end of May 2022].  Awaiting hypercoagulable  work-up; D-dimer levels etc.  Discussed that patient might have to go back on anticoagulation based upon close of the work-up/especially DVT/PE since the unprovoked.  If needed would recommend low-dose anticoagulation [Xarelto; Eliquis-expensive].   #Etiology-of PE is unclear; seems to be unprovoked.  Awaiting echo from today.  #Counseled the patient regarding the risk of DVT/PE with prolonged immobility.  Given his upcoming travel to [place]; recommend frequent stretching/increase fluid intake.   # DISPOSITION: vacation/ [place]- back on 11th.  #  Follow up to TBD- Dr.B  All questions were answered. The patient knows to call the clinic with any problems, questions  or concerns.    Cammie Sickle, MD 06/17/2021 6:24 PM

## 2021-06-12 NOTE — Assessment & Plan Note (Addendum)
#  Left lower lobe subsegmental PE with distal infarction/left lower lobe consolidation-unprovoked.  S/p Eliquis now x6 months [until end of May 2022].  Awaiting hypercoagulable work-up; D-dimer levels etc.  Discussed that patient might have to go back on anticoagulation based upon close of the work-up/especially DVT/PE since the unprovoked.  If needed would recommend low-dose anticoagulation [Xarelto; Eliquis-expensive].   #Etiology-of PE is unclear; seems to be unprovoked.  Awaiting echo from today.  #Counseled the patient regarding the risk of DVT/PE with prolonged immobility.  Given his upcoming travel to [facility]; recommend frequent stretching/increase fluid intake.   # DISPOSITION: vacation/ [place]- back on 11th.  #  Follow up to TBD- Dr.B

## 2021-06-13 LAB — PROTEIN S, TOTAL: Protein S Ag, Total: 96 % (ref 60–150)

## 2021-06-13 LAB — BETA-2-GLYCOPROTEIN I ABS, IGG/M/A
Beta-2 Glyco I IgG: <9 GPI IgG units (ref 0–20)
Beta-2-Glycoprotein I IgA: <9 GPI IgA units (ref 0–25)
Beta-2-Glycoprotein I IgM: <9 GPI IgM units (ref 0–32)

## 2021-06-14 LAB — ANTIPHOSPHOLIPID SYNDROME PROF
Anticardiolipin IgG: <9 GPL U/mL (ref 0–14)
Anticardiolipin IgM: 9 MPL U/mL (ref 0–12)
DRVVT: 34.6 s (ref 0.0–47.0)
PTT Lupus Anticoagulant: 29.2 s (ref 0.0–51.9)

## 2021-06-14 LAB — PROTEIN C, TOTAL: Protein C, Total: 105 % (ref 60–150)

## 2021-06-18 LAB — FACTOR 5 LEIDEN

## 2021-06-19 LAB — PROTHROMBIN GENE MUTATION

## 2021-08-29 IMAGING — US US EXTREM LOW VENOUS
1 series · 13 of 24 positions shown · non-contrast
Comparison: None.

CLINICAL DATA: History of pulmonary embolism.  Evaluate for DVT.



[Series 1: us venous img lower bilat (dvt) · portal-venous · 13 of 69 slices shown]
[im 1/69]
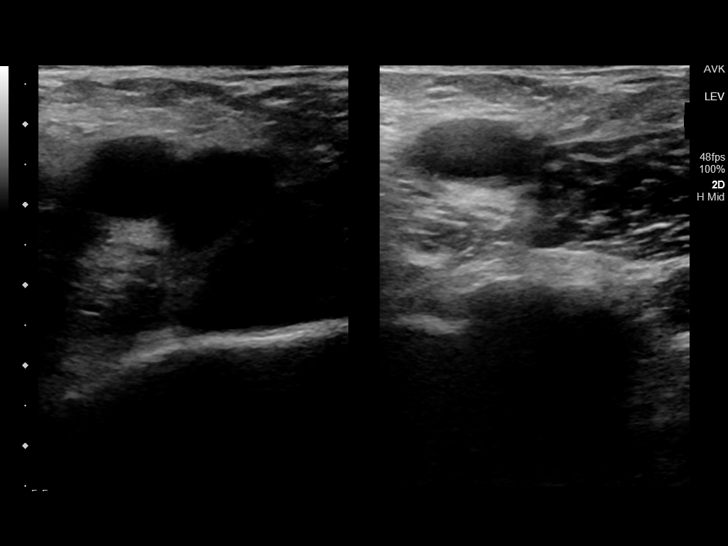
[im 6/69]
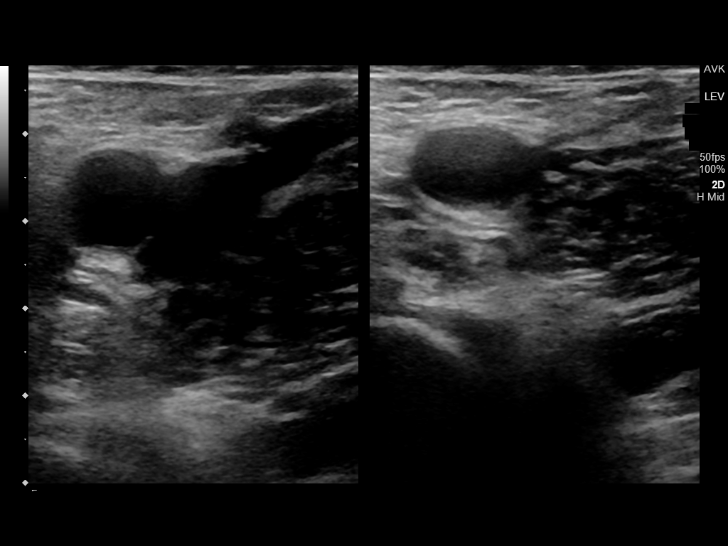
[im 12/69]
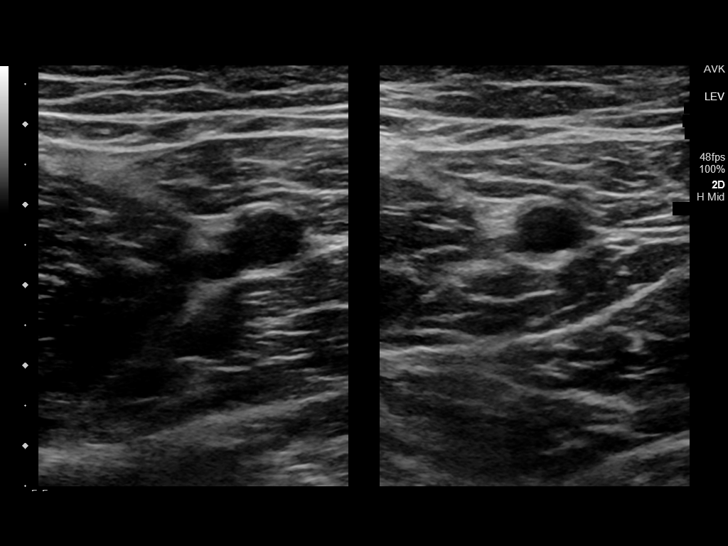
[im 18/69]
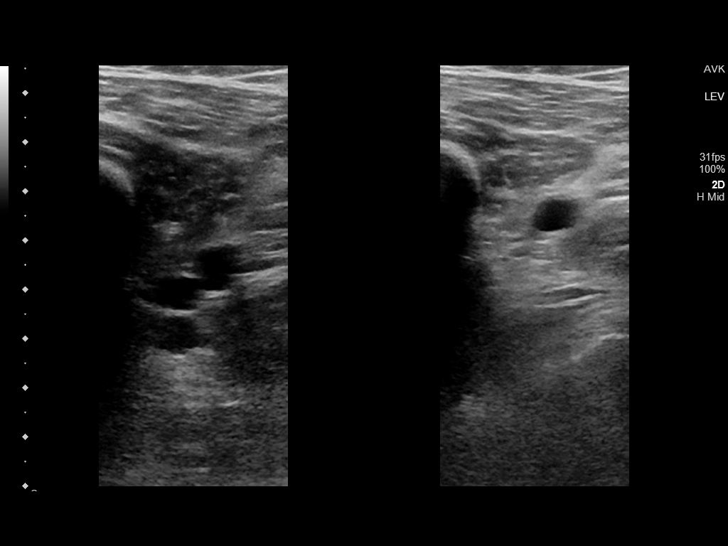
[im 24/69]
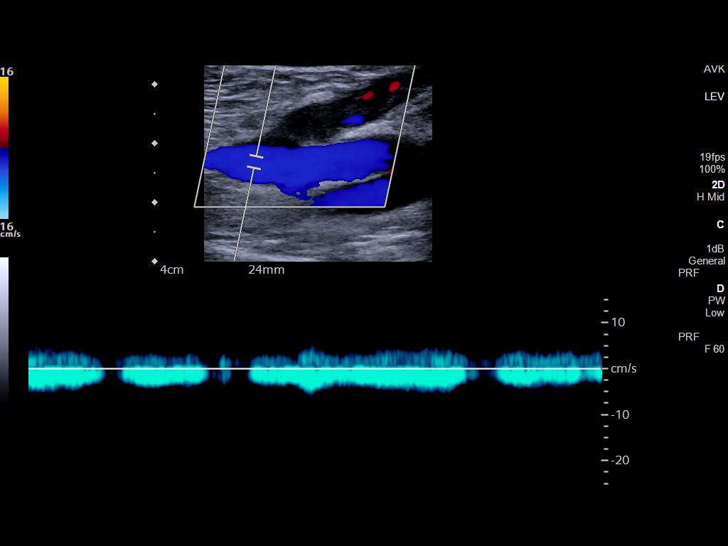
[im 30/69]
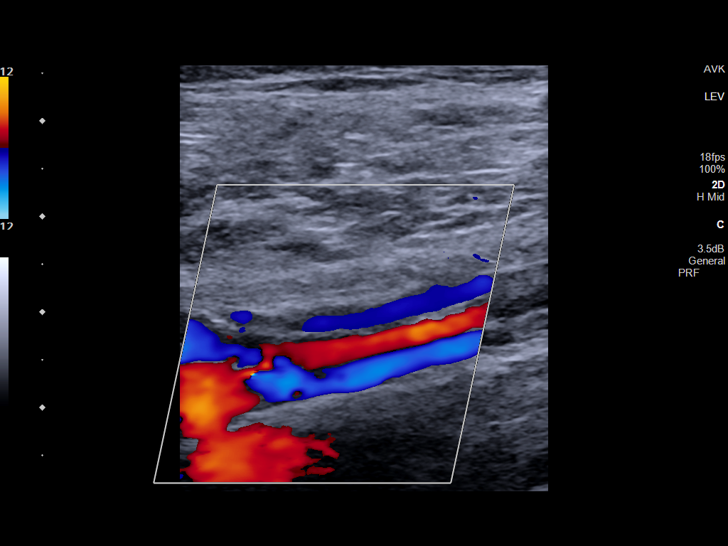
[im 36/69]
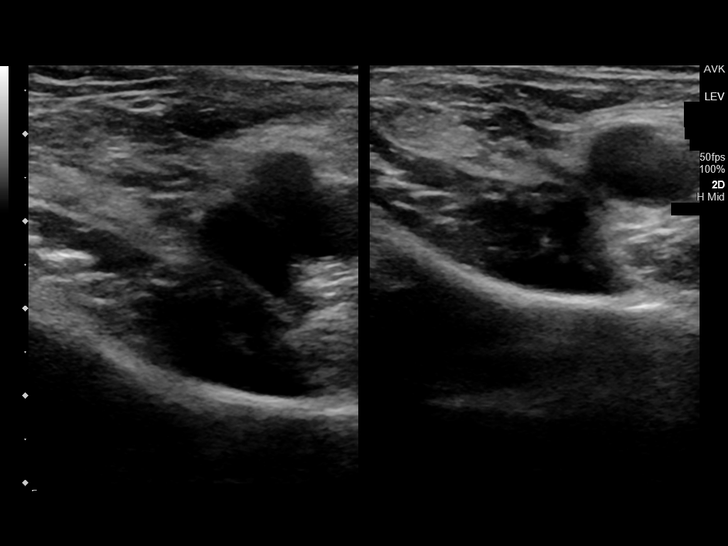
[im 39/69]
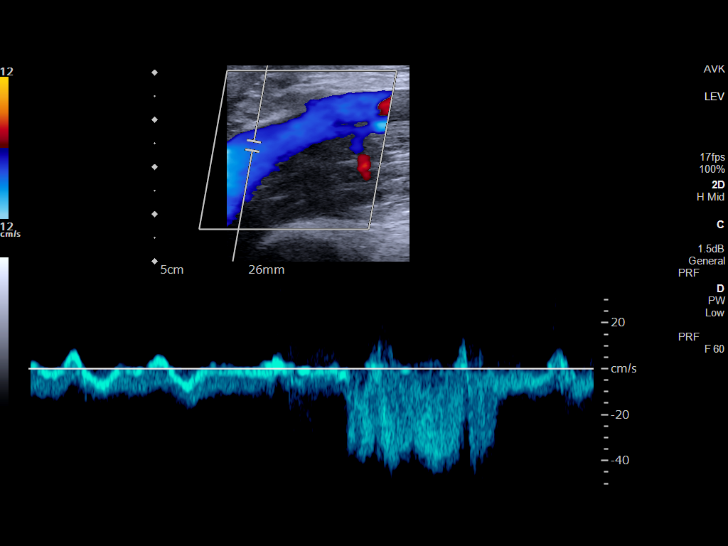
[im 45/69]
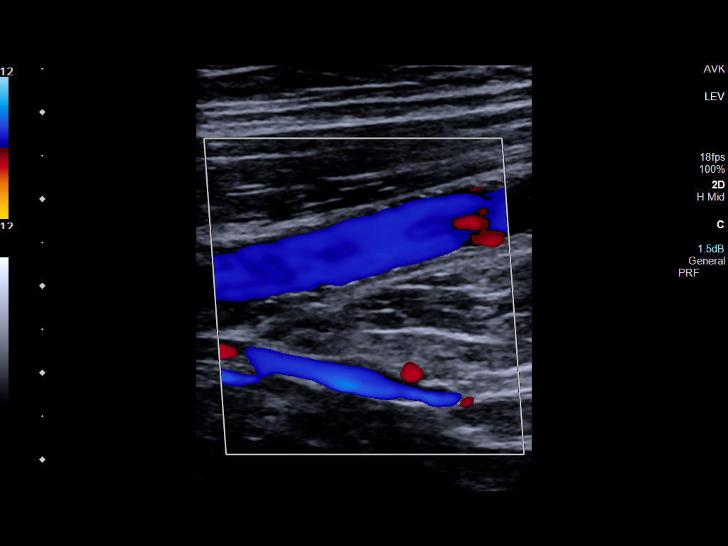
[im 51/69]
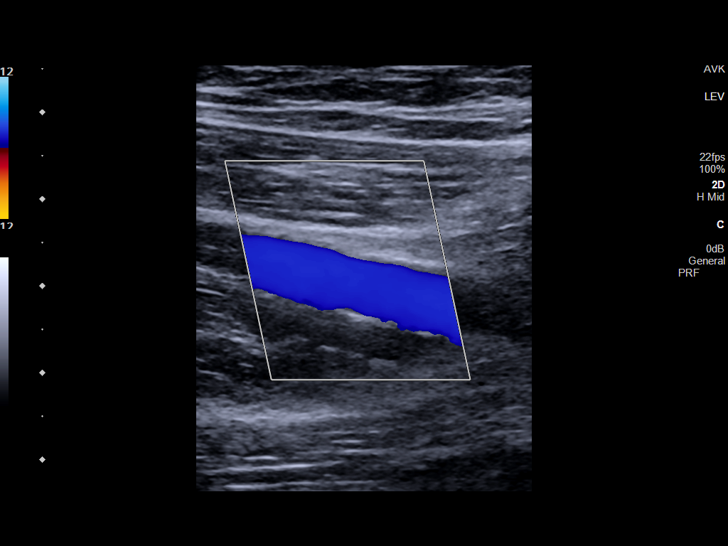
[im 57/69]
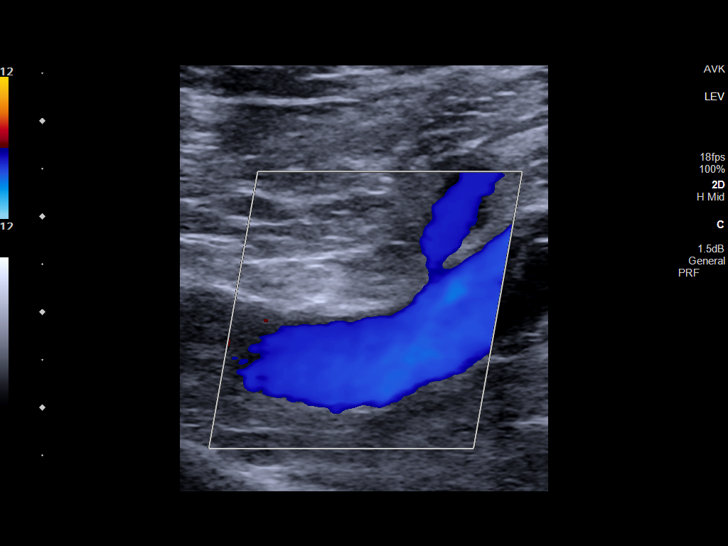
[im 63/69]
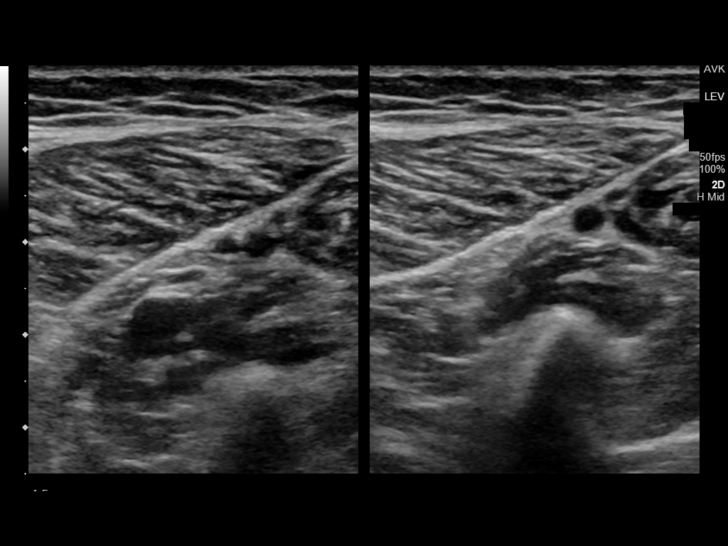
[im 69/69]
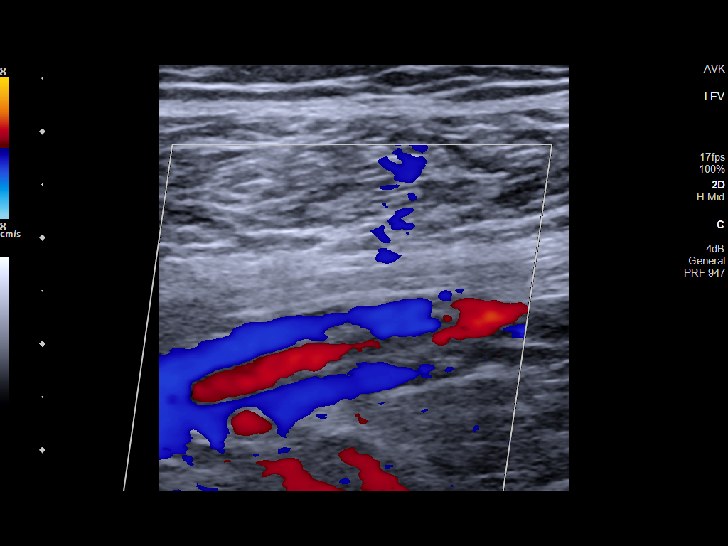

[13 of 24 positions shown; findings below may reference images not displayed]

FINDINGS: RIGHT LOWER EXTREMITY

Common Femoral Vein: No evidence of thrombus. Normal
compressibility, respiratory phasicity and response to augmentation.

Saphenofemoral Junction: No evidence of thrombus. Normal
compressibility and flow on color Doppler imaging.

Profunda Femoral Vein: No evidence of thrombus. Normal
compressibility and flow on color Doppler imaging.

Femoral Vein: No evidence of thrombus. Normal compressibility,
respiratory phasicity and response to augmentation.

Popliteal Vein: No evidence of thrombus. Normal compressibility,
respiratory phasicity and response to augmentation.

Calf Veins: No evidence of thrombus. Normal compressibility and flow
on color Doppler imaging.

Superficial Great Saphenous Vein: No evidence of thrombus. Normal
compressibility.

Venous Reflux:  None.

Other Findings: There is hypoechoic occlusive thrombus involving the
right lesser saphenous vein (image 24).

LEFT LOWER EXTREMITY

Common Femoral Vein: No evidence of thrombus. Normal
compressibility, respiratory phasicity and response to augmentation.

Saphenofemoral Junction: No evidence of thrombus. Normal
compressibility and flow on color Doppler imaging.

Profunda Femoral Vein: No evidence of thrombus. Normal
compressibility and flow on color Doppler imaging.

Femoral Vein: No evidence of thrombus. Normal compressibility,
respiratory phasicity and response to augmentation.

Popliteal Vein: No evidence of thrombus. Normal compressibility,
respiratory phasicity and response to augmentation.

Calf Veins: No evidence of thrombus. Normal compressibility and flow
on color Doppler imaging.

Superficial Great Saphenous Vein: No evidence of thrombus. Normal
compressibility.

Venous Reflux:  None.

Other Findings:  None.
IMPRESSION: 1. No evidence of DVT within either lower extremity.
2. Examination is positive for occlusive superficial
thrombophlebitis involving the right lesser saphenous vein. Again,
there is no extension of this occlusive SVT to the deep venous
system of the right lower extremity.
